# Patient Record
Sex: Female | Born: 1960 | Race: White | Hispanic: No | Smoking: Current every day smoker
Health system: Southern US, Community
[De-identification: ages and names within clinical notes are randomized; demographics above are authoritative.]

## PROBLEM LIST (undated history)

## (undated) DIAGNOSIS — N289 Disorder of kidney and ureter, unspecified: Secondary | ICD-10-CM

## (undated) DIAGNOSIS — F329 Major depressive disorder, single episode, unspecified: Secondary | ICD-10-CM

## (undated) DIAGNOSIS — M549 Dorsalgia, unspecified: Secondary | ICD-10-CM

## (undated) DIAGNOSIS — G4700 Insomnia, unspecified: Secondary | ICD-10-CM

## (undated) DIAGNOSIS — M5432 Sciatica, left side: Secondary | ICD-10-CM

## (undated) DIAGNOSIS — G8929 Other chronic pain: Secondary | ICD-10-CM

## (undated) DIAGNOSIS — F32A Depression, unspecified: Secondary | ICD-10-CM

---

## 2015-03-28 ENCOUNTER — Emergency Department (HOSPITAL_COMMUNITY)
Admission: EM | Admit: 2015-03-28 | Discharge: 2015-03-28 | Disposition: A | Discharge status: Home / Self Care | Payer: Self-pay | Attending: Emergency Medicine | Admitting: Emergency Medicine

## 2015-03-28 ENCOUNTER — Encounter (HOSPITAL_COMMUNITY): Payer: Self-pay | Admitting: Emergency Medicine

## 2015-03-28 DIAGNOSIS — M5442 Lumbago with sciatica, left side: Secondary | ICD-10-CM | POA: Insufficient documentation

## 2015-03-28 DIAGNOSIS — F329 Major depressive disorder, single episode, unspecified: Secondary | ICD-10-CM | POA: Insufficient documentation

## 2015-03-28 DIAGNOSIS — F17210 Nicotine dependence, cigarettes, uncomplicated: Secondary | ICD-10-CM | POA: Insufficient documentation

## 2015-03-28 DIAGNOSIS — Z79899 Other long term (current) drug therapy: Secondary | ICD-10-CM | POA: Insufficient documentation

## 2015-03-28 DIAGNOSIS — M5432 Sciatica, left side: Secondary | ICD-10-CM

## 2015-03-28 HISTORY — DX: Major depressive disorder, single episode, unspecified: F32.9

## 2015-03-28 HISTORY — DX: Depression, unspecified: F32.A

## 2015-03-28 HISTORY — DX: Insomnia, unspecified: G47.00

## 2015-03-28 MED ORDER — IBUPROFEN 800 MG PO TABS
800.0000 mg | ORAL_TABLET | Freq: Once | ORAL | Status: AC
Start: 2015-03-28 — End: 2015-03-28
  Administered 2015-03-28: 800 mg via ORAL
  Filled 2015-03-28: qty 1

## 2015-03-28 MED ORDER — NAPROXEN 500 MG PO TABS: 500.0000 mg | ORAL_TABLET | Freq: Two times a day (BID) | ORAL | Status: DC

## 2015-03-28 MED ORDER — METHOCARBAMOL 500 MG PO TABS: 500.0000 mg | ORAL_TABLET | Freq: Three times a day (TID) | ORAL | Status: DC

## 2015-03-28 MED ORDER — METHOCARBAMOL 500 MG PO TABS
500.0000 mg | ORAL_TABLET | Freq: Once | ORAL | Status: AC
  Administered 2015-03-28: 500 mg via ORAL
  Filled 2015-03-28: qty 1

## 2015-03-28 MED ORDER — TRAMADOL HCL 50 MG PO TABS: 50.0000 mg | ORAL_TABLET | Freq: Four times a day (QID) | ORAL | Status: DC | PRN

## 2015-03-28 NOTE — Discharge Instructions (Signed)
Sciatica °Sciatica is pain, weakness, numbness, or tingling along your sciatic nerve. The nerve starts in the lower back and runs down the back of each leg. Nerve damage or certain conditions pinch or put pressure on the sciatic nerve. This causes the pain, weakness, and other discomforts of sciatica. °HOME CARE  °· Only take medicine as told by your doctor. °· Apply ice to the affected area for 20 minutes. Do this 3-4 times a day for the first 48-72 hours. Then try heat in the same way. °· Exercise, stretch, or do your usual activities if these do not make your pain worse. °· Go to physical therapy as told by your doctor. °· Keep all doctor visits as told. °· Do not wear high heels or shoes that are not supportive. °· Get a firm mattress if your mattress is too soft to lessen pain and discomfort. °GET HELP RIGHT AWAY IF:  °· You cannot control when you poop (bowel movement) or pee (urinate). °· You have more weakness in your lower back, lower belly (pelvis), butt (buttocks), or legs. °· You have redness or puffiness (swelling) of your back. °· You have a burning feeling when you pee. °· You have pain that gets worse when you lie down. °· You have pain that wakes you from your sleep. °· Your pain is worse than past pain. °· Your pain lasts longer than 4 weeks. °· You are suddenly losing weight without reason. °MAKE SURE YOU:  °· Understand these instructions. °· Will watch this condition. °· Will get help right away if you are not doing well or get worse. °  °This information is not intended to replace advice given to you by your health care provider. Make sure you discuss any questions you have with your health care provider. °  °Document Released: 10/06/2007 Document Revised: 09/17/2014 Document Reviewed: 05/08/2011 °Elsevier Interactive Patient Education ©2016 [employer] ° ° °[facility] Doctor List ° ° ° °Edward Hawkins MD. Specialty: Pulmonary Disease Contact information: [address]  °[address]  °[place] [address]  °[phone]  ° °Margaret Simpson, MD. Specialty: Family Medicine Contact information: [address]  °[place] [address]  °[phone]  ° °Scott Luking, MD. Specialty: Family Medicine Contact information: [address]  °[address]  °[place] [address]  °[phone]  ° °Tesfaye Fanta, MD Specialty: Internal Medicine Contact information: [address]  °[place] [address]  °[phone]  ° °Zach Hall, MD. Specialty: Internal Medicine Contact information: [address]  °[place] [address]  °[phone]  ° °Angus Mcinnis, MD. Specialty: Family Medicine Contact information: [address]  °[place] [address]  °[phone]  ° °Stephen Knowlton, MD. Specialty: Family Medicine Contact information: [address]  °[address]  ° [place] [address]  °[phone]  ° °Roy Fagan, MD. Specialty: Internal Medicine Contact information: [address]  °[address]  °  [address]  °[phone]  ° ° °

## 2015-03-28 NOTE — ED Notes (Signed)
PT c/o lower back pain radiating down left leg after picking up a clothes basket x2 days with hx of DJD per pt.

## 2015-03-29 NOTE — ED Provider Notes (Signed)
CSN: [id number]     Arrival date & time 03/28/15  1419 History   First MD Initiated Contact with Patient 03/28/15 1551     Chief Complaint  Patient presents with  . Back Pain     (Consider location/radiation/quality/duration/timing/severity/associated sxs/prior Treatment) HPI  Meredith Marquez is a 55 y.o. female who presents to the Emergency Department complaining of left sided low back pain for 2 days.  She states that she bent over to pick up a basket of clothes and felt a sharp pain into her lower back that radiates into her left buttock, hip and lower leg.  Pain is constant and sharp.  She reports hx of same.  She has taken tylenol without relief.  She denies abd pain, fever, chills, urine or bowel changes, numbness or weakness of the lower extremities.     Past Medical History  Diagnosis Date  . Insomnia   . Depression    History reviewed. No pertinent past surgical history. Family History  Problem Relation Age of Onset  . Seizures Other    Social History  Substance Use Topics  . Smoking status: Current Every Day Smoker -- 1.00 packs/day for 40 years    Types: Cigarettes  . Smokeless tobacco: Never Used  . Alcohol Use: Yes     Comment: occasional   OB History    Gravida Para Term Preterm AB TAB SAB Ectopic Multiple Living   3 3 3       3      Review of Systems  Constitutional: Negative for fever.  Respiratory: Negative for shortness of breath.   Gastrointestinal: Negative for vomiting, abdominal pain and constipation.  Genitourinary: Negative for dysuria, hematuria, flank pain, decreased urine volume and difficulty urinating.  Musculoskeletal: Positive for back pain. Negative for joint swelling.  Skin: Negative for rash.  Neurological: Negative for weakness and numbness.  All other systems reviewed and are negative.     Allergies  Codeine; Penicillins; and Tylenol  Home Medications   Prior to Admission medications   Medication Sig Start Date End Date Taking?  Authorizing Provider  sertraline (ZOLOFT) 50 MG tablet Take 50 mg by mouth daily.   Yes Historical Provider, MD  traZODone (DESYREL) 100 MG tablet Take 100 mg by mouth at bedtime.   Yes Historical Provider, MD  methocarbamol (ROBAXIN) 500 MG tablet Take 1 tablet (500 mg total) by mouth 3 (three) times daily. 03/28/15   Markie Heffernan, PA-C  naproxen (NAPROSYN) 500 MG tablet Take 1 tablet (500 mg total) by mouth 2 (two) times daily with a meal. 03/28/15   Jireh Elmore, PA-C  traMADol (ULTRAM) 50 MG tablet Take 1 tablet (50 mg total) by mouth every 6 (six) hours as needed. 03/28/15   Caitlain Tweed, PA-C   BP 147/69 mmHg  Pulse 92  Temp(Src) 97.7 F (36.5 C) (Oral)  Resp 16  Ht 4\' 11"  (1.499 m)  Wt 45.36 kg  BMI 20.19 kg/m2  SpO2 100% Physical Exam  Constitutional: She is oriented to person, place, and time. She appears well-developed and well-nourished. No distress.  HENT:  Head: Normocephalic and atraumatic.  Neck: Normal range of motion. Neck supple.  Cardiovascular: Normal rate, regular rhythm, normal heart sounds and intact distal pulses.   No murmur heard. Pulmonary/Chest: Effort normal and breath sounds normal. No respiratory distress.  Abdominal: Soft. She exhibits no distension. There is no tenderness. There is no rebound.  Musculoskeletal: She exhibits tenderness. She exhibits no edema.       Lumbar  back: She exhibits tenderness and pain. She exhibits normal range of motion, no swelling, no deformity, no laceration and normal pulse.  ttp of the left lumbar paraspinal muscles and SI joint.  No spinal tenderness.  DP pulses are brisk and symmetrical.  Distal sensation intact.  Pt has 5/5 strength against resistance of bilateral lower extremities.     Neurological: She is alert and oriented to person, place, and time. She has normal strength. No sensory deficit. She exhibits normal muscle tone. Coordination and gait normal.  Reflex Scores:      Patellar reflexes are 2+ on the right  side and 2+ on the left side.      Achilles reflexes are 2+ on the right side and 2+ on the left side. Skin: Skin is warm and dry. No rash noted.  Nursing note and vitals reviewed.   ED Course  Procedures (including critical care time) Labs Review Labs Reviewed - No data to display  Imaging Review No results found. I have personally reviewed and evaluated these images and lab results as part of my medical decision-making.   EKG Interpretation None      MDM   Final diagnoses:  Left sided sciatica    Pt is well appearing, ambulates with a steady gait.  No focal neuro deficits.  No concerning sx's for emergent neurological or infectious process.  She appears stable for d/c and given referral info to establish primary care.      Pauline Aus, PA-C 03/29/15 2235  Bethann Berkshire, MD 04/01/15 2004

## 2015-12-20 ENCOUNTER — Encounter (HOSPITAL_COMMUNITY): Payer: Self-pay | Admitting: Emergency Medicine

## 2015-12-20 ENCOUNTER — Emergency Department (HOSPITAL_COMMUNITY): Payer: Self-pay

## 2015-12-20 ENCOUNTER — Emergency Department (HOSPITAL_COMMUNITY)
Admission: EM | Admit: 2015-12-20 | Discharge: 2015-12-20 | Disposition: A | Discharge status: Home / Self Care | Payer: Self-pay | Attending: Emergency Medicine | Admitting: Emergency Medicine

## 2015-12-20 DIAGNOSIS — Y929 Unspecified place or not applicable: Secondary | ICD-10-CM | POA: Insufficient documentation

## 2015-12-20 DIAGNOSIS — Y999 Unspecified external cause status: Secondary | ICD-10-CM | POA: Insufficient documentation

## 2015-12-20 DIAGNOSIS — Z7982 Long term (current) use of aspirin: Secondary | ICD-10-CM | POA: Insufficient documentation

## 2015-12-20 DIAGNOSIS — Y939 Activity, unspecified: Secondary | ICD-10-CM | POA: Insufficient documentation

## 2015-12-20 DIAGNOSIS — M5442 Lumbago with sciatica, left side: Secondary | ICD-10-CM | POA: Insufficient documentation

## 2015-12-20 DIAGNOSIS — N201 Calculus of ureter: Secondary | ICD-10-CM | POA: Insufficient documentation

## 2015-12-20 DIAGNOSIS — F17210 Nicotine dependence, cigarettes, uncomplicated: Secondary | ICD-10-CM | POA: Insufficient documentation

## 2015-12-20 DIAGNOSIS — W108XXA Fall (on) (from) other stairs and steps, initial encounter: Secondary | ICD-10-CM | POA: Insufficient documentation

## 2015-12-20 HISTORY — DX: Dorsalgia, unspecified: M54.9

## 2015-12-20 HISTORY — DX: Sciatica, left side: M54.32

## 2015-12-20 HISTORY — DX: Other chronic pain: G89.29

## 2015-12-20 LAB — CBC WITH DIFFERENTIAL/PLATELET
BASOS ABS: 0 10*3/uL (ref 0.0–0.1)
Basophils Relative: 0 %
Eosinophils Absolute: 0.1 10*3/uL (ref 0.0–0.7)
Eosinophils Relative: 1 %
HEMATOCRIT: 36.6 % (ref 36.0–46.0)
Hemoglobin: 12.2 g/dL (ref 12.0–15.0)
LYMPHS PCT: 23 %
Lymphs Abs: 2 10*3/uL (ref 0.7–4.0)
MCH: 30.7 pg (ref 26.0–34.0)
MCHC: 33.3 g/dL (ref 30.0–36.0)
MCV: 92.2 fL (ref 78.0–100.0)
MONO ABS: 0.4 10*3/uL (ref 0.1–1.0)
Monocytes Relative: 4 %
NEUTROS ABS: 6.2 10*3/uL (ref 1.7–7.7)
Neutrophils Relative %: 72 %
Platelets: 265 10*3/uL (ref 150–400)
RBC: 3.97 MIL/uL (ref 3.87–5.11)
RDW: 13.2 % (ref 11.5–15.5)
WBC: 8.6 10*3/uL (ref 4.0–10.5)

## 2015-12-20 LAB — BASIC METABOLIC PANEL
ANION GAP: 9 (ref 5–15)
BUN: 15 mg/dL (ref 6–20)
CO2: 26 mmol/L (ref 22–32)
Calcium: 9.3 mg/dL (ref 8.9–10.3)
Chloride: 105 mmol/L (ref 101–111)
Creatinine, Ser: 0.78 mg/dL (ref 0.44–1.00)
GFR calc Af Amer: >60 mL/min (ref >60)
GLUCOSE: 126 mg/dL — AB (ref 65–99)
POTASSIUM: 3.3 mmol/L — AB (ref 3.5–5.1)
Sodium: 140 mmol/L (ref 135–145)

## 2015-12-20 LAB — URINALYSIS, ROUTINE W REFLEX MICROSCOPIC
BACTERIA UA: NONE SEEN
Bilirubin Urine: NEGATIVE
Glucose, UA: NEGATIVE mg/dL
Ketones, ur: 5 mg/dL — AB
Nitrite: NEGATIVE
PROTEIN: NEGATIVE mg/dL
Specific Gravity, Urine: 1.012 (ref 1.005–1.030)
pH: 5 (ref 5.0–8.0)

## 2015-12-20 MED ORDER — KETOROLAC TROMETHAMINE 60 MG/2ML IM SOLN
30.0000 mg | Freq: Once | INTRAMUSCULAR | Status: AC
  Administered 2015-12-20: 30 mg via INTRAMUSCULAR
  Filled 2015-12-20: qty 2

## 2015-12-20 MED ORDER — CYCLOBENZAPRINE HCL 10 MG PO TABS
10.0000 mg | ORAL_TABLET | Freq: Once | ORAL | Status: AC
  Administered 2015-12-20: 10 mg via ORAL
  Filled 2015-12-20: qty 1

## 2015-12-20 MED ORDER — TRAMADOL HCL 50 MG PO TABS: 50.0000 mg | ORAL_TABLET | Freq: Four times a day (QID) | ORAL | 0 refills | Status: AC | PRN

## 2015-12-20 MED ORDER — KETOROLAC TROMETHAMINE 60 MG/2ML IM SOLN: 60.0000 mg | Freq: Once | INTRAMUSCULAR | Status: DC

## 2015-12-20 MED ORDER — NAPROXEN 250 MG PO TABS: 250.0000 mg | ORAL_TABLET | Freq: Two times a day (BID) | ORAL | 0 refills | Status: AC | PRN

## 2015-12-20 MED ORDER — METHOCARBAMOL 500 MG PO TABS: 1000.0000 mg | ORAL_TABLET | Freq: Four times a day (QID) | ORAL | 0 refills | Status: AC | PRN

## 2015-12-20 NOTE — ED Notes (Signed)
Pt walked to and from restroom with steady gait.

## 2015-12-20 NOTE — Discharge Instructions (Signed)
Take the prescriptions as directed.  Apply moist heat or ice to the area(s) of discomfort, for 15 minutes at a time, several times per day for the next few days.  Do not fall asleep on a heating or ice pack.  Call your regular medical doctor and the Urologist tomorrow to schedule a follow up appointment this week.  Return to the Emergency Department immediately if worsening.

## 2015-12-20 NOTE — ED Provider Notes (Signed)
[facility]PT Provider Note   CSN: [id number] Arrival date & time: 12/20/15  1526     History   Chief Complaint Chief Complaint  Patient presents with  . Fall    HPI Meredith Marquez is a 55 y.o. female.  HPI  Pt was seen at 1735. Per pt, c/o gradual onset and persistence of constant acute flair of her chronic low back "pain" since yesterday. Pt states the pain began after she slipped and fell onto her buttocks. Describes the pain as located in her lower back and radiating down her left leg, per her usual chronic pain pattern.  Pain worsens with palpation of the area and body position changes. Denies incont/retention of bowel or bladder, no saddle anesthesia, no focal motor weakness, no tingling/numbness in extremities, no fevers, no abd pain.   The symptoms have been associated with no other complaints. The patient has a significant history of similar symptoms previously, recently being evaluated for this complaint and multiple prior evals for same.    Past Medical History:  Diagnosis Date  . Chronic back pain   . Depression   . Insomnia   . Sciatica of left side     There are no active problems to display for this patient.   Past Surgical History:  Procedure Laterality Date  . CESAREAN SECTION      OB History    Gravida Para Term Preterm AB Living   3 3 3     3    SAB TAB Ectopic Multiple Live Births                   Home Medications    Prior to Admission medications   Medication Sig Start Date End Date Taking? Authorizing Provider  methocarbamol (ROBAXIN) 500 MG tablet Take 1 tablet (500 mg total) by mouth 3 (three) times daily. 03/28/15   Tammy Triplett, PA-C  naproxen (NAPROSYN) 500 MG tablet Take 1 tablet (500 mg total) by mouth 2 (two) times daily with a meal. 03/28/15   Tammy Triplett, PA-C  sertraline (ZOLOFT) 50 MG tablet Take 50 mg by mouth daily.    Historical Provider, MD  traMADol (ULTRAM) 50 MG tablet Take 1 tablet (50 mg total) by mouth every 6  (six) hours as needed. 03/28/15   Tammy Triplett, PA-C  traZODone (DESYREL) 100 MG tablet Take 100 mg by mouth at bedtime.    Historical Provider, MD    Family History Family History  Problem Relation Age of Onset  . Seizures Other     Social History Social History  Substance Use Topics  . Smoking status: Current Every Day Smoker    Packs/day: 1.00    Years: 40.00    Types: Cigarettes  . Smokeless tobacco: Never Used  . Alcohol use Yes     Comment: occasional     Allergies   Codeine; Penicillins; and Tylenol [acetaminophen]   Review of Systems Review of Systems ROS: Statement: All systems negative except as marked or noted in the HPI; Constitutional: Negative for fever and chills. ; ; Eyes: Negative for eye pain, redness and discharge. ; ; ENMT: Negative for ear pain, hoarseness, nasal congestion, sinus pressure and sore throat. ; ; Cardiovascular: Negative for chest pain, palpitations, diaphoresis, dyspnea and peripheral edema. ; ; Respiratory: Negative for cough, wheezing and stridor. ; ; Gastrointestinal: Negative for nausea, vomiting, diarrhea, abdominal pain, blood in stool, hematemesis, jaundice and rectal bleeding. . ; ; Genitourinary: Negative for dysuria, flank pain and hematuria. ; ;  Musculoskeletal: +LBP. Negative for neck pain. Negative for swelling and trauma.; ; Skin: Negative for pruritus, rash, abrasions, blisters, bruising and skin lesion.; ; Neuro: Negative for headache, lightheadedness and neck stiffness. Negative for weakness, altered level of consciousness, altered mental status, extremity weakness, paresthesias, involuntary movement, seizure and syncope.     Physical Exam Updated Vital Signs BP 171/69 (BP Location: Left Arm)   Pulse 95   Temp 97.6 F (36.4 C) (Oral)   Resp 18   Ht 4\' 8"  (1.422 m)   Wt 86 lb (39 kg)   SpO2 100%   BMI 19.28 kg/m   Physical Exam 1740: Physical examination:  Nursing notes reviewed; Vital signs and O2 SAT reviewed;   Constitutional: Well developed, Well nourished, Well hydrated, In no acute distress; Head:  Normocephalic, atraumatic; Eyes: EOMI, PERRL, No scleral icterus; ENMT: Mouth and pharynx normal, Mucous membranes moist; Neck: Supple, Full range of motion, No lymphadenopathy; Cardiovascular: Regular rate and rhythm, No murmur, rub, or gallop; Respiratory: Breath sounds clear & equal bilaterally, No rales, rhonchi, wheezes.  Speaking full sentences with ease, Normal respiratory effort/excursion; Chest: Nontender, Movement normal; Abdomen: Soft, Nontender, Nondistended, Normal bowel sounds; Genitourinary: No CVA tenderness; Spine:  No midline CS, TS, LS tenderness. +TTP left lumbar paraspinal muscles. No rash, no ecchymosis.;; Extremities: Pulses normal, No tenderness, No edema, No calf edema or asymmetry.; Neuro: AA&Ox3, Major CN grossly intact.  Speech clear. No gross focal motor or sensory deficits in extremities. Strength 5/5 equal bilat UE's and LE's, including great toe dorsiflexion.  DTR 2/4 equal bilat UE's and LE's.  No gross sensory deficits.  Neg straight leg raises bilat.  Climbs on and off stretcher easily by herself. Gait steady.; Skin: Color normal, Warm, Dry.   ED Treatments / Results  Labs (all labs ordered are listed, but only abnormal results are displayed)   EKG  EKG Interpretation None       Radiology   Procedures Procedures (including critical care time)  Medications Ordered in ED Medications  cyclobenzaprine (FLEXERIL) tablet 10 mg (not administered)  ketorolac (TORADOL) injection 30 mg (not administered)     Initial Impression / Assessment and Plan / ED Course  I have reviewed the triage vital signs and the nursing notes.  Pertinent labs & imaging results that were available during my care of the patient were reviewed by me and considered in my medical decision making (see chart for details).  MDM Reviewed: previous chart, nursing note and vitals Interpretation: CT  scan and labs    Ct Lumbar Spine Wo Contrast Result Date: 12/20/2015 CLINICAL DATA:  Fall on IAC steps yesterday. Chronic low back pain and left leg pain. Unable to urinate since yesterday. EXAM: CT LUMBAR SPINE WITHOUT CONTRAST TECHNIQUE: Multidetector CT imaging of the lumbar spine was performed without intravenous contrast administration. Multiplanar CT image reconstructions were also generated. COMPARISON:  None. FINDINGS: Segmentation: 5 lumbar type vertebrae. Alignment: Mild lumbar levoscoliosis. 3 mm degenerative anterolisthesis of L4 on L5. Vertebrae: No acute fracture or focal pathologic process. Paraspinal and other soft tissues: Abdominal aortic atherosclerosis without aneurysm. Severe left hydronephrosis with moderate renal cortical thinning. The visualized portion of the left ureter is moderately dilated diffusely. The distal left ureter in the pelvis was not imaged. Unremarkable noncontrast appearance of the right kidney. Disc levels: T12-L1:  Discal calcification.  No disc herniation or stenosis. L1-2:  Negative. L2-3:  Negative. L3-4: Minimal right facet arthrosis. No disc herniation or stenosis. L4-5: Moderate disc space narrowing with vacuum  disc phenomenon. Listhesis with asymmetric leftward bulging of the uncovered disc and mild facet arthrosis result in minimal left neural foraminal narrowing. No evidence of exiting left L4 nerve compression. No spinal stenosis. L5-S1: Suspected small left paracentral disc protrusion near the left S1 nerve root. No stenosis. IMPRESSION: 1. No acute fracture. 2. L4-5 predominant disc degeneration with minimal left neural foraminal narrowing. 3. Likely small left paracentral disc protrusion at L5-S1. 4. Severe left hydronephrosis with diffuse dilatation of the visualized portion of the left ureter (distal left ureter not imaged). Given left renal cortical thinning and lack of perinephric stranding, this may not be acute. 5. Aortic atherosclerosis.  Electronically Signed   By: Sebastian AcheAllen  Grady M.D.   On: 12/20/2015 19:22   Ct Renal Stone Study Result Date: 12/20/2015 CLINICAL DATA:  Unable to urinate since yesterday. EXAM: CT ABDOMEN AND PELVIS WITHOUT CONTRAST TECHNIQUE: Multidetector CT imaging of the abdomen and pelvis was performed following the standard protocol without IV contrast. COMPARISON:  CT of the lumbosacral spine 12/20/2015 FINDINGS: Lower chest: No acute abnormality. Hepatobiliary: No focal liver abnormality is seen. No gallstones, gallbladder wall thickening, or biliary dilatation. Pancreas: Unremarkable. No pancreatic ductal dilatation or surrounding inflammatory changes. Spleen: Normal in size without focal abnormality. Adrenals/Urinary Tract: There is severe left hydronephrosis and hydroureter likely caused by 8 10 mm obstructive distal left ureteral calculus located within the pelvic portion of the ureter. There is perinephric stranding. There is left greater than right retroperitoneal lymphadenopathy. With the largest lymph node measuring 13 mm in short axis. Stomach/Bowel: Stomach is within normal limits. Appendix appears normal. No evidence of bowel wall thickening, distention, or inflammatory changes. Vascular/Lymphatic: No significant vascular findings are present. Reproductive: Uterus and bilateral adnexa are unremarkable. Bilateral fallopian tube closure devices in place. Other: No abdominal wall hernia or abnormality. No abdominopelvic ascites. Musculoskeletal: No acute findings. Osteoarthritic changes of the lumbosacral spine. IMPRESSION: 10 mm left distal ureteral calculus with associated severe obstructive uropathy. Left greater than right retroperitoneal lymphadenopathy. This may represent reactive lymphadenopathy, however follow-up with contrast-enhanced CT after resolution of patient's acute symptoms may be considered to exclude etiology other than left obstructive uropathy. Electronically Signed   By: Ted Mcalpineobrinka  Dimitrova M.D.    On: 12/20/2015 20:37   Results for orders placed or performed during the hospital encounter of 12/20/15  Basic metabolic panel  Result Value Ref Range   Sodium 140 135 - 145 mmol/L   Potassium 3.3 (L) 3.5 - 5.1 mmol/L   Chloride 105 101 - 111 mmol/L   CO2 26 22 - 32 mmol/L   Glucose, Bld 126 (H) 65 - 99 mg/dL   BUN 15 6 - 20 mg/dL   Creatinine, Ser 6.040.78 0.44 - 1.00 mg/dL   Calcium 9.3 8.9 - 54.010.3 mg/dL   GFR calc non Af Amer >60 >60 mL/min   GFR calc Af Amer >60 >60 mL/min   Anion gap 9 5 - 15  CBC with Differential  Result Value Ref Range   WBC 8.6 4.0 - 10.5 K/uL   RBC 3.97 3.87 - 5.11 MIL/uL   Hemoglobin 12.2 12.0 - 15.0 g/dL   HCT 98.136.6 19.136.0 - 47.846.0 %   MCV 92.2 78.0 - 100.0 fL   MCH 30.7 26.0 - 34.0 pg   MCHC 33.3 30.0 - 36.0 g/dL   RDW 29.513.2 62.111.5 - 30.815.5 %   Platelets 265 150 - 400 K/uL   Neutrophils Relative % 72 %   Neutro Abs 6.2 1.7 - 7.7 K/uL  Lymphocytes Relative 23 %   Lymphs Abs 2.0 0.7 - 4.0 K/uL   Monocytes Relative 4 %   Monocytes Absolute 0.4 0.1 - 1.0 K/uL   Eosinophils Relative 1 %   Eosinophils Absolute 0.1 0.0 - 0.7 K/uL   Basophils Relative 0 %   Basophils Absolute 0.0 0.0 - 0.1 K/uL  Urinalysis, Routine w reflex microscopic  Result Value Ref Range   Color, Urine YELLOW YELLOW   APPearance CLEAR CLEAR   Specific Gravity, Urine 1.012 1.005 - 1.030   pH 5.0 5.0 - 8.0   Glucose, UA NEGATIVE NEGATIVE mg/dL   Hgb urine dipstick SMALL (A) NEGATIVE   Bilirubin Urine NEGATIVE NEGATIVE   Ketones, ur 5 (A) NEGATIVE mg/dL   Protein, ur NEGATIVE NEGATIVE mg/dL   Nitrite NEGATIVE NEGATIVE   Leukocytes, UA TRACE (A) NEGATIVE   RBC / HPF 0-5 0 - 5 RBC/hpf   WBC, UA 6-30 0 - 5 WBC/hpf   Bacteria, UA NONE SEEN NONE SEEN    2050:  Pt has climbed off her stretcher easily by herself, ambulated to the bathroom and urinated several times while in the ED. When ED RN and I went to check on pt, she was sitting on stretcher comfortably and eating a fast food meal  with family members in the room. Pt initially told ED RN she "hadn't urinated" since yesterday afternoon, but now states that has not been the case. Endorses now that she "saw blood in my urine last month" and has been urinating regularly. CT scans as above. Udip and labs ordered.   2225:  T/C to Uro Dr. Berneice HeinrichManny, case discussed, including:  HPI, pertinent PM/SHx, VS/PE, dx testing, ED course and treatment:  He has viewed the CT scan, states these changes are not acute and have been present for quite some time, tx symptomatically, f/u in office. Dx and testing, as well as d/w Uro MD, d/w pt.  Questions answered.  Verb understanding, agreeable to d/c home with outpt f/u.   Final Clinical Impressions(s) / ED Diagnoses   Final diagnoses:  None    New Prescriptions New Prescriptions   No medications on file     Samuel JesterKathleen Mellisa Arshad, DO 12/24/15 2059

## 2015-12-20 NOTE — ED Triage Notes (Signed)
PT states she slipped and fell onto her outdoor steps yesterday and landed onto her buttock and started having lower back pain that radiates down left leg. PT also states she has not been able to urinate since the fall at 1600 yesterday evening.

## 2015-12-22 LAB — URINE CULTURE

## 2016-03-07 ENCOUNTER — Emergency Department
Admission: EM | Admit: 2016-03-07 | Discharge: 2016-03-07 | Disposition: A | Discharge status: Home / Self Care | Payer: Self-pay | Attending: Emergency Medicine | Admitting: Emergency Medicine

## 2016-03-07 DIAGNOSIS — J069 Acute upper respiratory infection, unspecified: Secondary | ICD-10-CM | POA: Insufficient documentation

## 2016-03-07 DIAGNOSIS — N390 Urinary tract infection, site not specified: Secondary | ICD-10-CM | POA: Insufficient documentation

## 2016-03-07 DIAGNOSIS — N132 Hydronephrosis with renal and ureteral calculous obstruction: Secondary | ICD-10-CM | POA: Insufficient documentation

## 2016-03-07 DIAGNOSIS — F17210 Nicotine dependence, cigarettes, uncomplicated: Secondary | ICD-10-CM | POA: Insufficient documentation

## 2016-03-07 HISTORY — DX: Disorder of kidney and ureter, unspecified: N28.9

## 2016-03-07 LAB — URINALYSIS, COMPLETE (UACMP) WITH MICROSCOPIC
Bacteria, UA: NONE SEEN
Bilirubin Urine: NEGATIVE
GLUCOSE, UA: NEGATIVE mg/dL
Ketones, ur: 20 mg/dL — AB
NITRITE: NEGATIVE
PH: 6 (ref 5.0–8.0)
Protein, ur: NEGATIVE mg/dL
SPECIFIC GRAVITY, URINE: 1.021 (ref 1.005–1.030)
Squamous Epithelial / LPF: NONE SEEN

## 2016-03-07 MED ORDER — ONDANSETRON 4 MG PO TBDP
4.0000 mg | ORAL_TABLET | Freq: Once | ORAL | Status: AC
  Administered 2016-03-07: 4 mg via ORAL
  Filled 2016-03-07: qty 1

## 2016-03-07 MED ORDER — ONDANSETRON HCL 8 MG PO TABS: 8.0000 mg | ORAL_TABLET | Freq: Three times a day (TID) | ORAL | 0 refills | Status: AC | PRN

## 2016-03-07 MED ORDER — HYDROCOD POLST-CPM POLST ER 10-8 MG/5ML PO SUER
5.0000 mL | Freq: Once | ORAL | Status: AC
  Administered 2016-03-07: 5 mL via ORAL
  Filled 2016-03-07: qty 5

## 2016-03-07 MED ORDER — PSEUDOEPH-BROMPHEN-DM 30-2-10 MG/5ML PO SYRP: 5.0000 mL | ORAL_SOLUTION | Freq: Four times a day (QID) | ORAL | 0 refills | Status: AC | PRN

## 2016-03-07 MED ORDER — SULFAMETHOXAZOLE-TRIMETHOPRIM 800-160 MG PO TABS: 1.0000 | ORAL_TABLET | Freq: Two times a day (BID) | ORAL | 0 refills | Status: AC

## 2016-03-07 MED ORDER — ONDANSETRON 8 MG PO TBDP
8.0000 mg | ORAL_TABLET | Freq: Once | ORAL | Status: AC
  Administered 2016-03-07: 8 mg via ORAL
  Filled 2016-03-07: qty 1

## 2016-03-07 MED ORDER — TRAMADOL HCL 50 MG PO TABS: 50.0000 mg | ORAL_TABLET | Freq: Four times a day (QID) | ORAL | 0 refills | Status: AC | PRN

## 2016-03-07 NOTE — ED Notes (Addendum)
States she has had n/v and weakness for couple of days. Cough and congestion for the past week   Also fell last pm  Landed on tailbone

## 2016-03-07 NOTE — ED Triage Notes (Signed)
Pt c/o cough with congestion and nausea for the past week.

## 2016-03-07 NOTE — ED Provider Notes (Signed)
[facility] Emergency Department Provider Note   ____________________________________________   First MD Initiated Contact with Patient 03/07/16 1455     (approximate)  I have reviewed the triage vital signs and the nursing notes.   HISTORY  Chief Complaint URI    HPI Meredith Marquez is a 56 y.o. female patient triage with complaint of cough, congestion, and nausea for 1 week. However patient arrives in the exam room she is complaining of dysuria, frequency, chronic low back pain, and I resolved kidney stone. Patient was seen in December 2017 and diagnosed for 10 mm ureteral stone with hydronephrosis. Patient was advised to follow with urology and did not comply.Patient is also complaining of back pain secondary to a fall last night. Patient rates the pain as a 5/10. No palliative measures for this complaint.  Past Medical History:  Diagnosis Date  . Chronic back pain   . Depression   . Insomnia   . Renal disorder    Kidney stones  . Sciatica of left side     There are no active problems to display for this patient.   Past Surgical History:  Procedure Laterality Date  . CESAREAN SECTION      Prior to Admission medications   Medication Sig Start Date End Date Taking? Authorizing Provider  aspirin EC 81 MG tablet Take 81 mg by mouth daily.    Historical Provider, MD  brompheniramine-pseudoephedrine-DM 30-2-10 MG/5ML syrup Take 5 mLs by mouth 4 (four) times daily as needed. 03/07/16   Joni Reiningonald K Jaedin Regina, PA-C  ibuprofen (ADVIL,MOTRIN) 200 MG tablet Take 600 mg by mouth every 6 (six) hours as needed for mild pain or moderate pain.    Historical Provider, MD  methocarbamol (ROBAXIN) 500 MG tablet Take 2 tablets (1,000 mg total) by mouth 4 (four) times daily as needed for muscle spasms (muscle spasm/pain). 12/20/15   Samuel JesterKathleen McManus, DO  naproxen (NAPROSYN) 250 MG tablet Take 1 tablet (250 mg total) by mouth 2 (two) times daily as needed for mild pain or  moderate pain (take with food). 12/20/15   Samuel JesterKathleen McManus, DO  ondansetron (ZOFRAN) 8 MG tablet Take 1 tablet (8 mg total) by mouth every 8 (eight) hours as needed for nausea or vomiting. 03/07/16   Joni Reiningonald K Eldar Robitaille, PA-C  sertraline (ZOLOFT) 50 MG tablet Take 50 mg by mouth daily.    Historical Provider, MD  sulfamethoxazole-trimethoprim (BACTRIM DS,SEPTRA DS) 800-160 MG tablet Take 1 tablet by mouth 2 (two) times daily. 03/07/16   Joni Reiningonald K Merriam Brandner, PA-C  traMADol (ULTRAM) 50 MG tablet Take 1 tablet (50 mg total) by mouth every 6 (six) hours as needed for moderate pain or severe pain. 12/20/15   Samuel JesterKathleen McManus, DO  traMADol (ULTRAM) 50 MG tablet Take 1 tablet (50 mg total) by mouth every 6 (six) hours as needed. 03/07/16 03/07/17  Joni Reiningonald K Azie Mcconahy, PA-C    Allergies Codeine; Penicillins; and Tylenol [acetaminophen]  Family History  Problem Relation Age of Onset  . Seizures Other     Social History Social History  Substance Use Topics  . Smoking status: Current Every Day Smoker    Packs/day: 1.00    Years: 40.00    Types: Cigarettes  . Smokeless tobacco: Never Used  . Alcohol use Yes     Comment: occasional    Review of Systems Constitutional: No fever/chills Eyes: No visual changes. ENT: No sore throat. Cardiovascular: Denies chest pain. Respiratory: Denies shortness of breath. Nonproductive cough Gastrointestinal: No abdominal pain.  Nausea and vomiting.  No diarrhea.  No constipation. Genitourinary: Hematuria Musculoskeletal: Chronic back pain. Left flank pain.  Skin: Negative for rash. Neurological: Negative for headaches, focal weakness or numbness.    ____________________________________________   PHYSICAL EXAM:  VITAL SIGNS: ED Triage Vitals [03/07/16 1438]  Enc Vitals Group     BP 132/61     Pulse Rate 97     Resp 16     Temp 97.6 F (36.4 C)     Temp Source Axillary     SpO2 98 %     Weight 87 lb (39.5 kg)     Height 4\' 11"  (1.499 m)     Head  Circumference      Peak Flow      Pain Score 8     Pain Loc      Pain Edu?      Excl. in GC?     Constitutional: Alert and oriented. Well appearing and in no acute distress. Eyes: Conjunctivae are normal. PERRL. EOMI. Head: Atraumatic. Nose: Edematous nasal turbinates clear rhinorrhea Mouth/Throat: Mucous membranes are moist.  Oropharynx non-erythematous. Neck: No stridor.  No cervical spine tenderness to palpation. Hematological/Lymphatic/Immunilogical: No cervical lymphadenopathy. Cardiovascular: Normal rate, regular rhythm. Grossly normal heart sounds.  Good peripheral circulation. Respiratory: Normal respiratory effort.  No retractions. Lungs CTAB. Nonproductive cough Gastrointestinal: Soft and nontender. No distention. No abdominal bruits. Left CVA tenderness. Musculoskeletal: No obvious spinal deformity. Moderate guarding palpation L3-S1. Patient has full nuchal range of motion lower extremities ambulate without distress. Neurologic:  Normal speech and language. No gross focal neurologic deficits are appreciated. No gait instability. Skin:  Skin is warm, dry and intact. No rash noted. Psychiatric: Mood and affect are normal. Speech and behavior are normal.  ____________________________________________   LABS (all labs ordered are listed, but only abnormal results are displayed)  Labs Reviewed  URINALYSIS, COMPLETE (UACMP) WITH MICROSCOPIC - Abnormal; Notable for the following:       Result Value   Color, Urine YELLOW (*)    APPearance CLOUDY (*)    Hgb urine dipstick SMALL (*)    Ketones, ur 20 (*)    Leukocytes, UA LARGE (*)    All other components within normal limits   ____________________________________________  EKG   ____________________________________________  RADIOLOGY   ____________________________________________   PROCEDURES  Procedure(s) performed: None  Procedures  Critical Care performed:  No  ____________________________________________   INITIAL IMPRESSION / ASSESSMENT AND PLAN / ED COURSE  Pertinent labs & imaging results that were available during my care of the patient were reviewed by me and considered in my medical decision making (see chart for details).  UTI. Discussed urinalysis results with patient. Reviewed CT scan taken December 2017 and discussed with patient rash now for follow-up with urologist. Patient also has a viral upper respiratory infection. Patient given discharge care instructions and prescription for Bactrim DS, Bromfed DM, tramadol, and Zofran.      ____________________________________________   FINAL CLINICAL IMPRESSION(S) / ED DIAGNOSES  Final diagnoses:  Viral upper respiratory tract infection  Hydronephrosis with urinary obstruction due to renal calculus  Urinary tract infection without hematuria, site unspecified      NEW MEDICATIONS STARTED DURING THIS VISIT:  New Prescriptions   BROMPHENIRAMINE-PSEUDOEPHEDRINE-DM 30-2-10 MG/5ML SYRUP    Take 5 mLs by mouth 4 (four) times daily as needed.   ONDANSETRON (ZOFRAN) 8 MG TABLET    Take 1 tablet (8 mg total) by mouth every 8 (eight) hours as needed for nausea or  vomiting.   SULFAMETHOXAZOLE-TRIMETHOPRIM (BACTRIM DS,SEPTRA DS) 800-160 MG TABLET    Take 1 tablet by mouth 2 (two) times daily.   TRAMADOL (ULTRAM) 50 MG TABLET    Take 1 tablet (50 mg total) by mouth every 6 (six) hours as needed.     Note:  This document was prepared using Dragon voice recognition software and may include unintentional dictation errors.    Joni Reining, PA-C 03/07/16 1548    Emily Filbert, MD 03/08/16 [phone]

## 2016-03-07 NOTE — ED Notes (Signed)
Vomited large amt of liquid

## 2017-12-26 IMAGING — CT CT RENAL STONE PROTOCOL
2 of 4 series · 16 of 46 positions shown, 18 images · non-contrast
Comparison: CT of the lumbosacral spine 12/20/2015

CLINICAL DATA: Unable to urinate since yesterday.

EXAM:
CT ABDOMEN AND PELVIS WITHOUT CONTRAST
TECHNIQUE: Multidetector CT imaging of the abdomen and pelvis was performed
following the standard protocol without IV contrast.

[Series 2: axial st · axial · 0.58mm/px · z∈[-468,-124]mm · 13 of 77 slices shown, 15 images]
[im 4/77  soft-tissue]
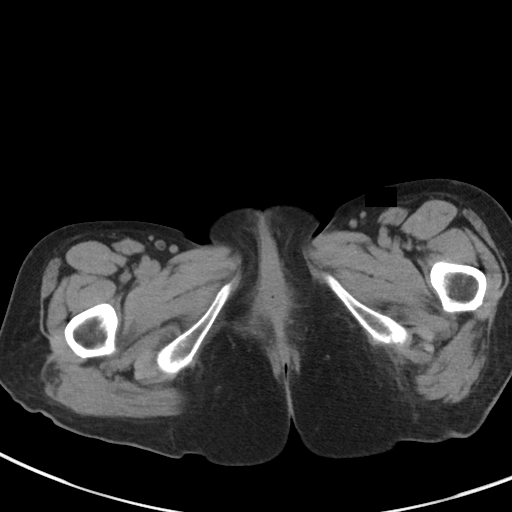
[im 4/77  bone]
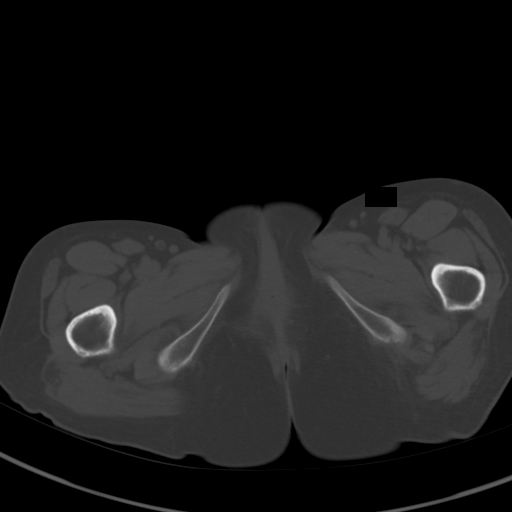
[im 10/77  soft-tissue]
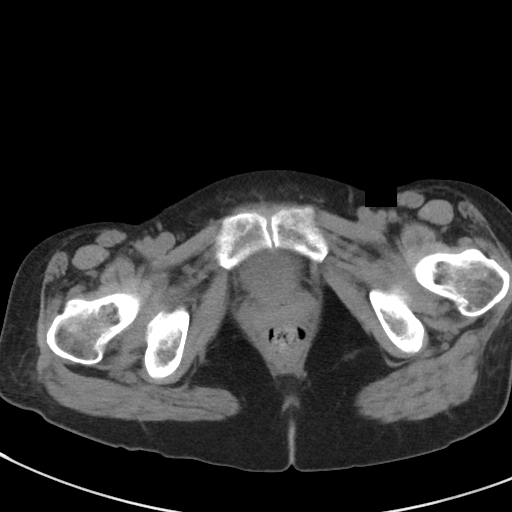
[im 16/77  soft-tissue]
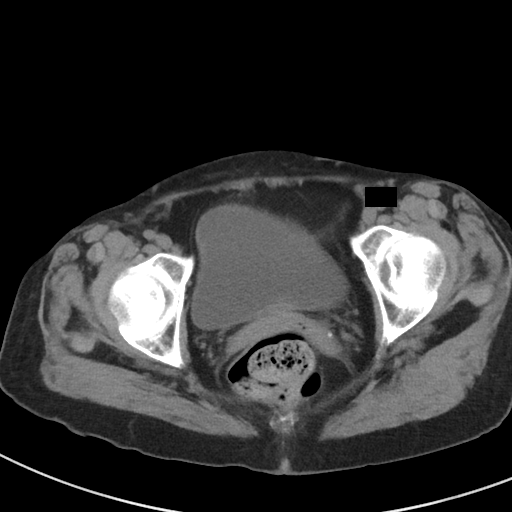
[im 23/77  soft-tissue]
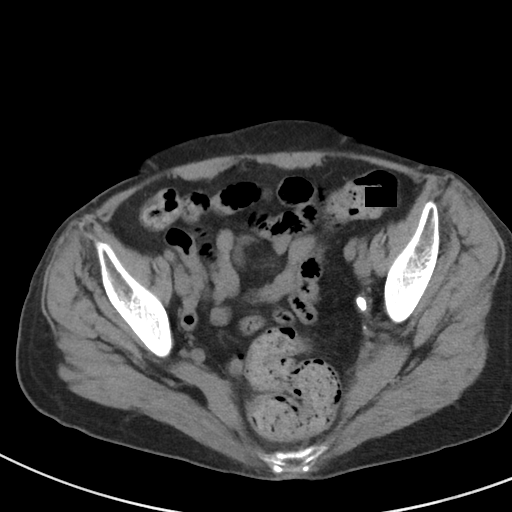
[im 26/77  soft-tissue]
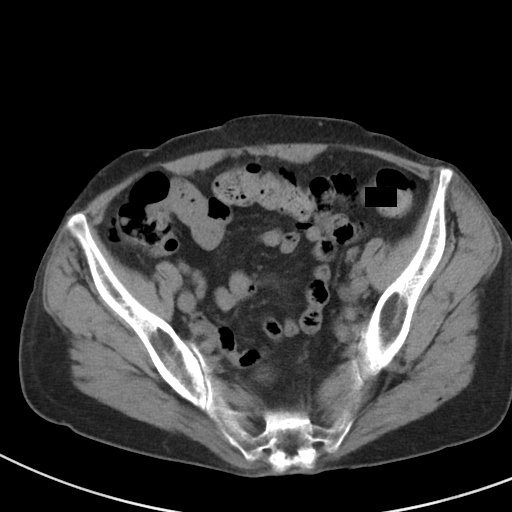
[im 32/77  soft-tissue]
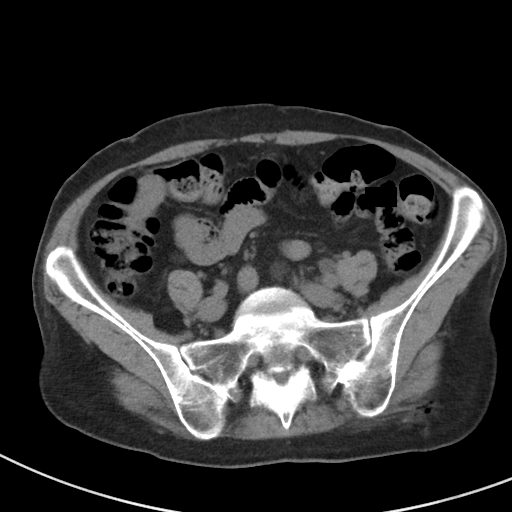
[im 39/77  soft-tissue]
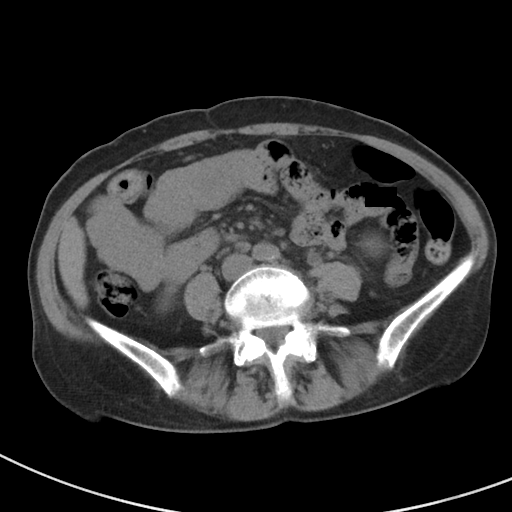
[im 45/77  soft-tissue]
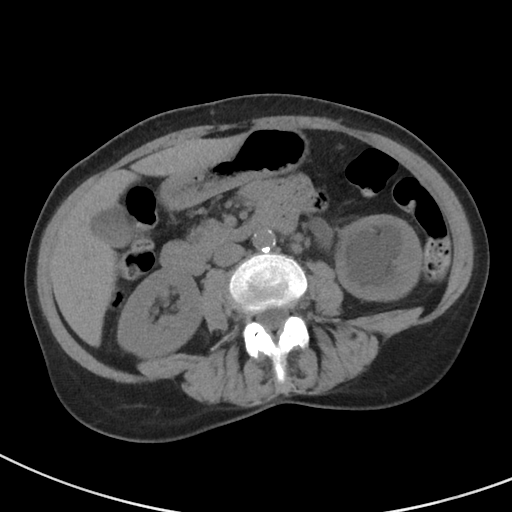
[im 51/77  soft-tissue]
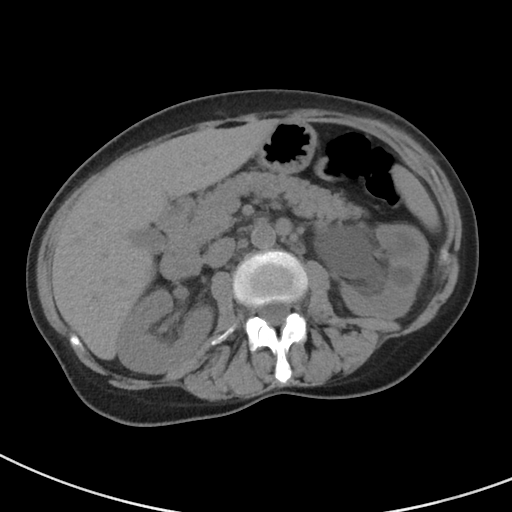
[im 51/77  bone]
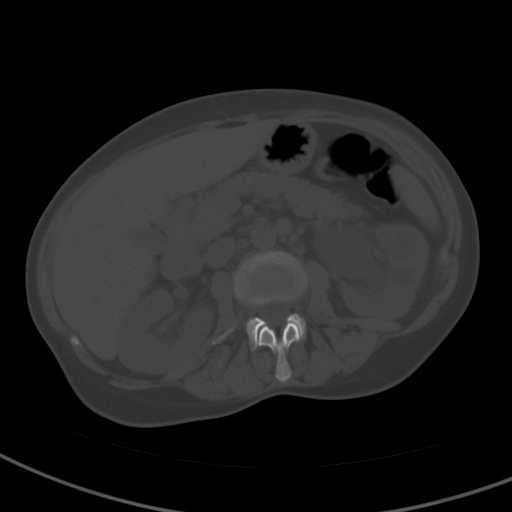
[im 54/77  soft-tissue]
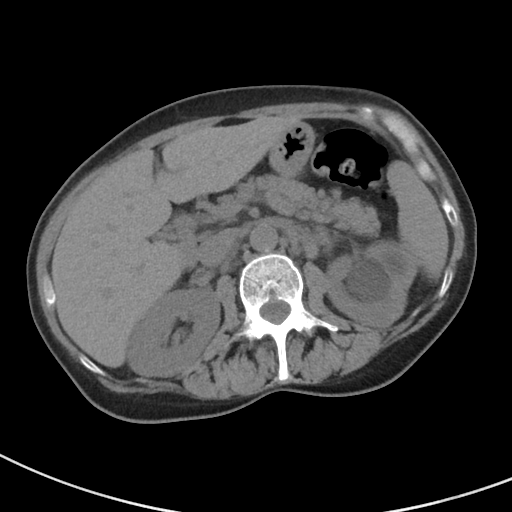
[im 61/77  soft-tissue]
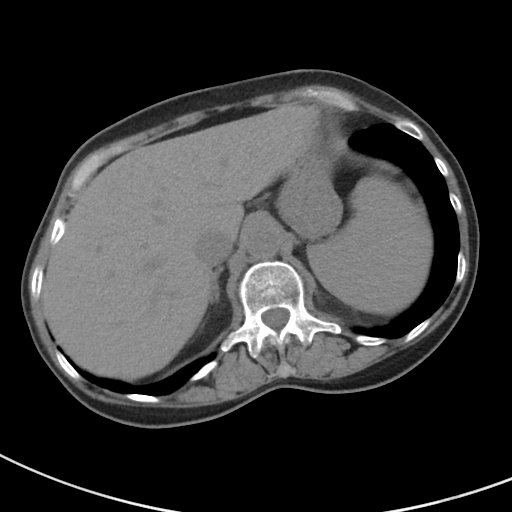
[im 67/77  soft-tissue]
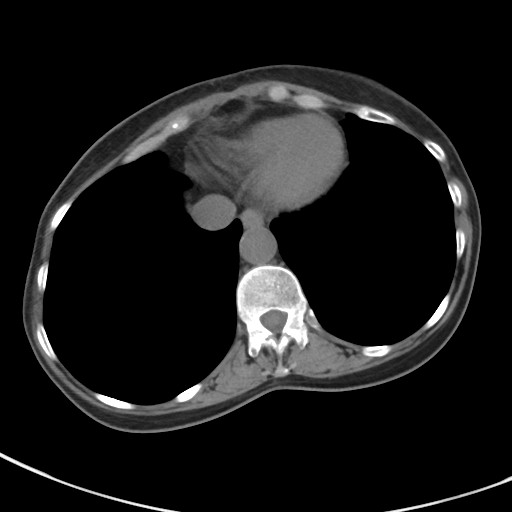
[im 73/77  soft-tissue]
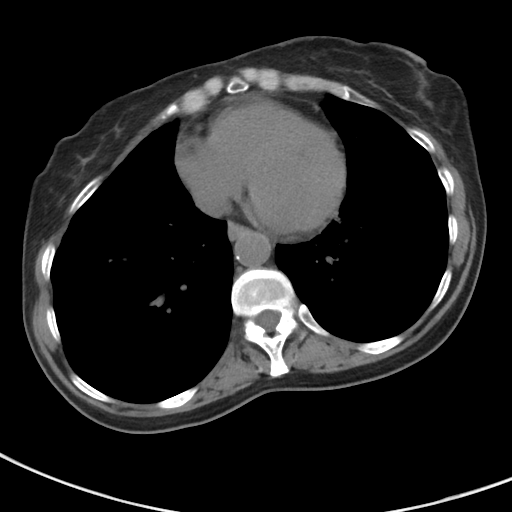

[Series 4: coronal st · coronal · 0.55mm/px · 3 of 79 slices shown]
[im 27/79  soft-tissue]
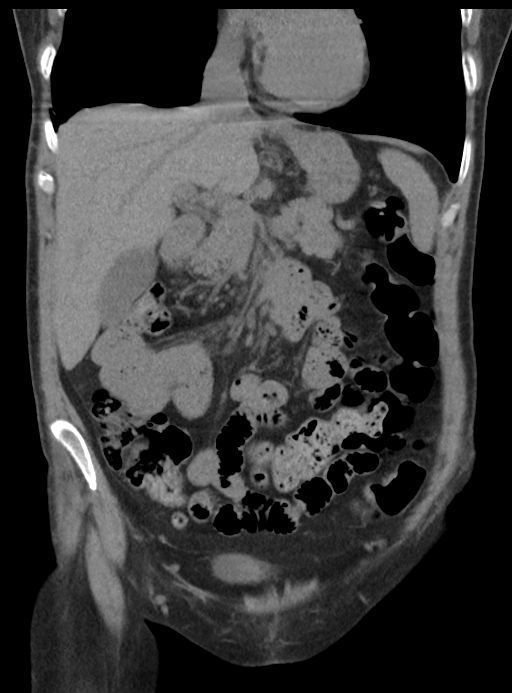
[im 35/79  soft-tissue]
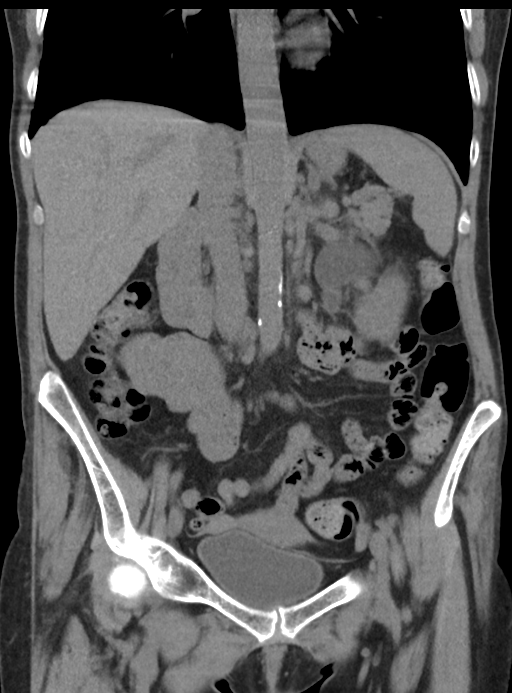
[im 44/79  soft-tissue]
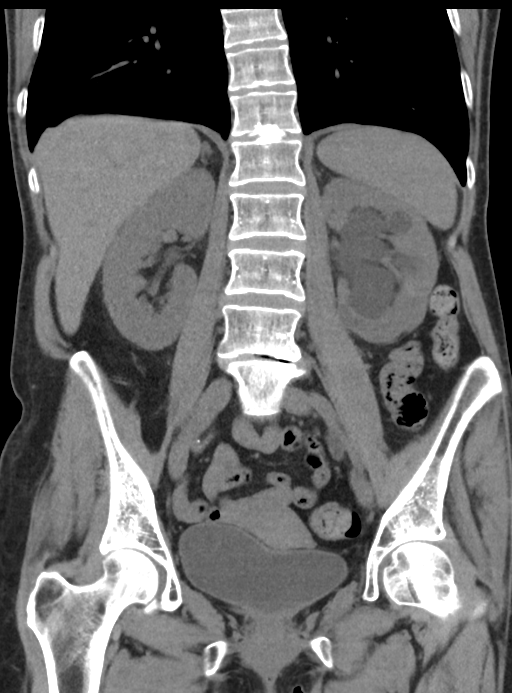

[16 of 46 positions shown; findings below may reference images not displayed]

FINDINGS: Lower chest: No acute abnormality.

Hepatobiliary: No focal liver abnormality is seen. No gallstones,
gallbladder wall thickening, or biliary dilatation.

Pancreas: Unremarkable. No pancreatic ductal dilatation or
surrounding inflammatory changes.

Spleen: Normal in size without focal abnormality.

Adrenals/Urinary Tract: There is severe left hydronephrosis and
hydroureter likely caused by 8 10 mm obstructive distal left
ureteral calculus located within the pelvic portion of the ureter.
There is perinephric stranding. There is left greater than right
retroperitoneal lymphadenopathy. With the largest lymph node
measuring 13 mm in short axis.

Stomach/Bowel: Stomach is within normal limits. Appendix appears
normal. No evidence of bowel wall thickening, distention, or
inflammatory changes.

Vascular/Lymphatic: No significant vascular findings are present.

Reproductive: Uterus and bilateral adnexa are unremarkable.
Bilateral fallopian tube closure devices in place.

Other: No abdominal wall hernia or abnormality. No abdominopelvic
ascites.

Musculoskeletal: No acute findings. Osteoarthritic changes of the
lumbosacral spine.
IMPRESSION: 10 mm left distal ureteral calculus with associated severe
obstructive uropathy.

Left greater than right retroperitoneal lymphadenopathy. This may
represent reactive lymphadenopathy, however follow-up with
contrast-enhanced CT after resolution of patient's acute symptoms
may be considered to exclude etiology other than left obstructive
uropathy.

## 2017-12-26 IMAGING — CT CT L SPINE W/O CM
3 of 4 series · 13 of 33 positions shown, 16 images · non-contrast
Comparison: None.

CLINICAL DATA: Fall on IAC steps yesterday. Chronic low back pain
and left leg pain. Unable to urinate since yesterday.

EXAM:
CT LUMBAR SPINE WITHOUT CONTRAST
TECHNIQUE: Multidetector CT imaging of the lumbar spine was performed without
intravenous contrast administration. Multiplanar CT image
reconstructions were also generated.

[Series 4: l spine soft · axial · 0.23mm/px · z∈[-402,-266]mm · 5 of 102 slices shown, 7 images]
[im 17/102  soft-tissue]
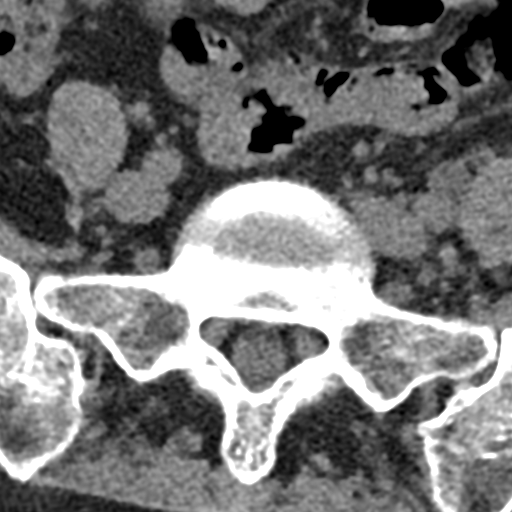
[im 17/102  bone]
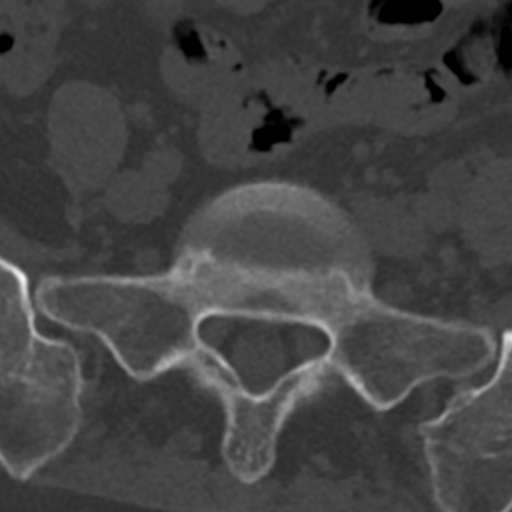
[im 34/102  bone]
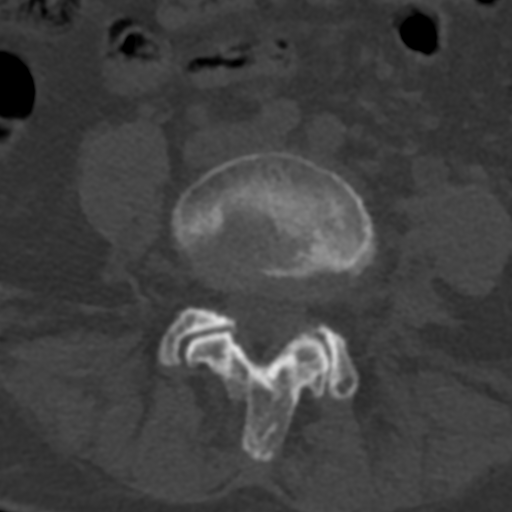
[im 51/102  bone]
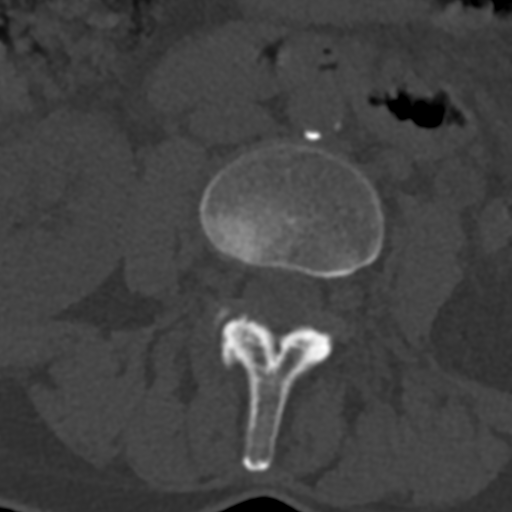
[im 68/102  bone]
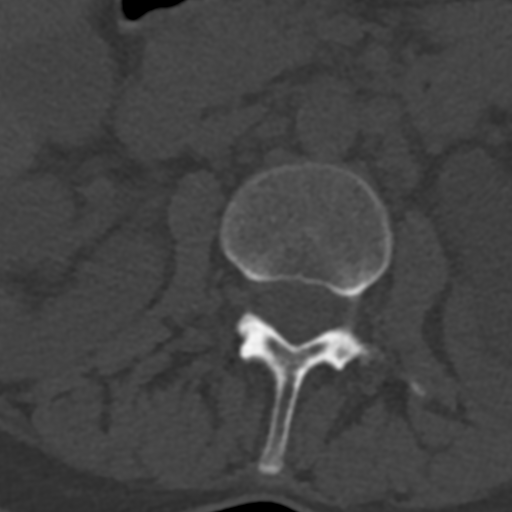
[im 85/102  soft-tissue]
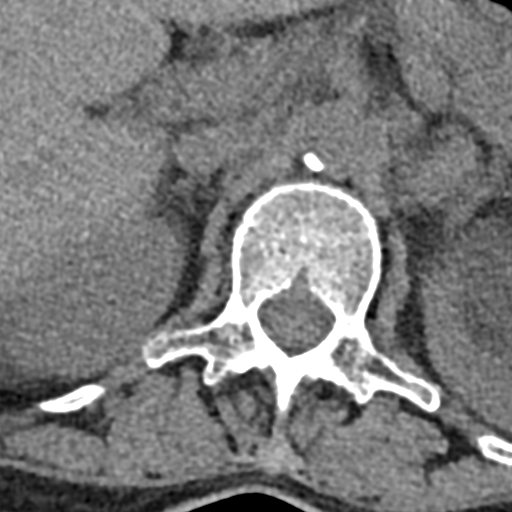
[im 85/102  bone]
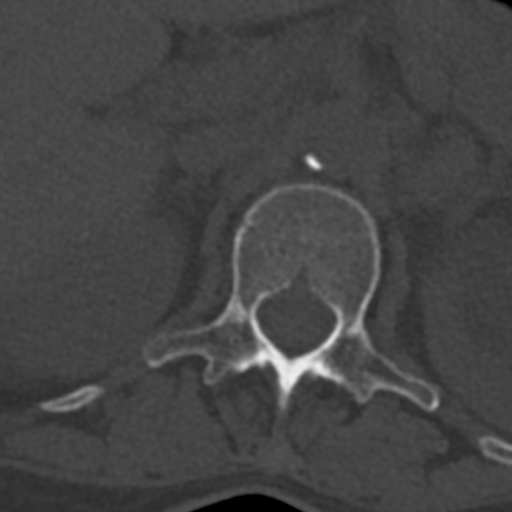

[Series 8: sagittal bone · sagittal · 0.32mm/px · 5 of 73 slices shown, 6 images]
[im 25/73  bone]
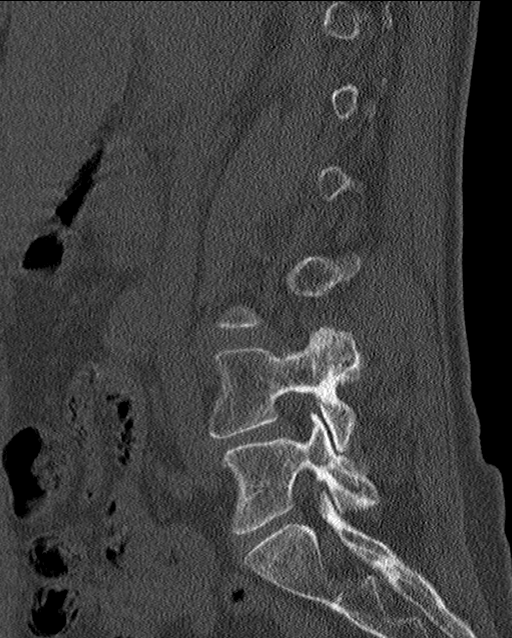
[im 31/73  bone]
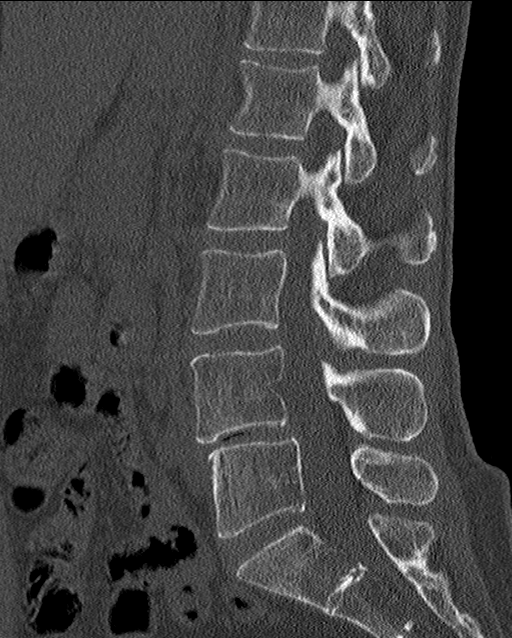
[im 37/73  soft-tissue]
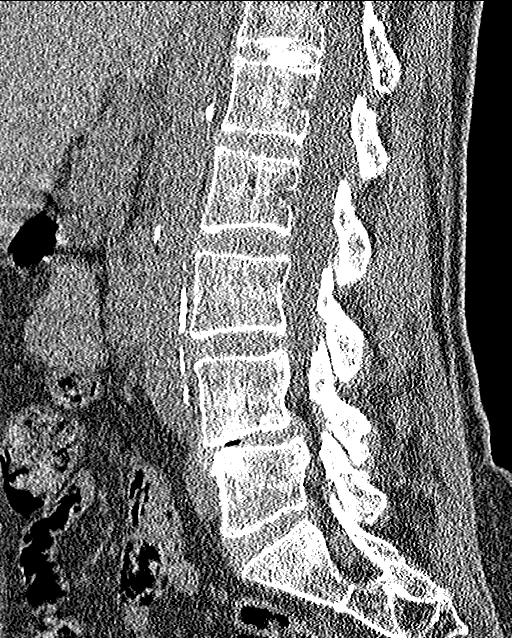
[im 37/73  bone]
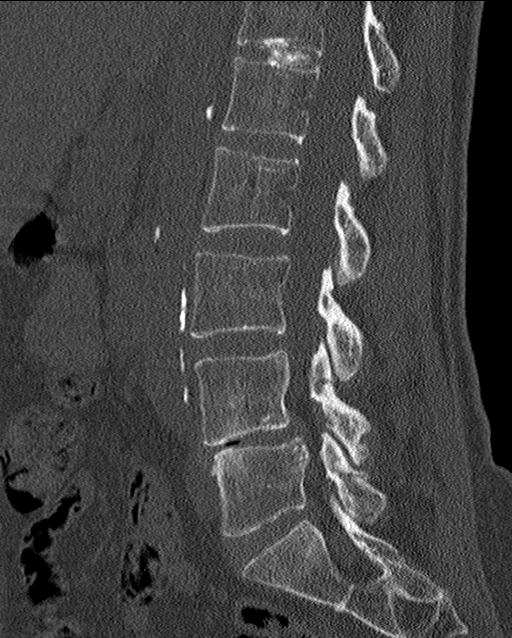
[im 43/73  bone]
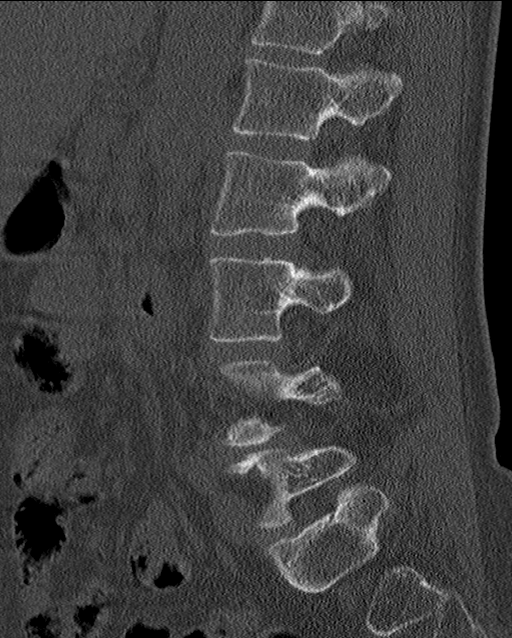
[im 49/73  bone]
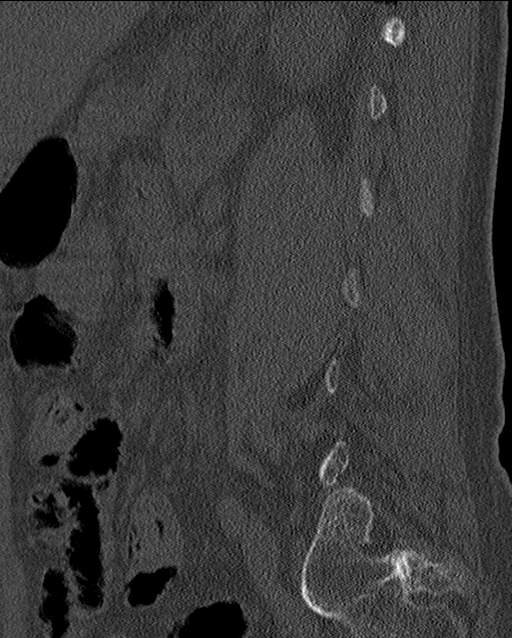

[Series 9: coronal bone · coronal · 0.28mm/px · 3 of 82 slices shown]
[im 17/82  bone]
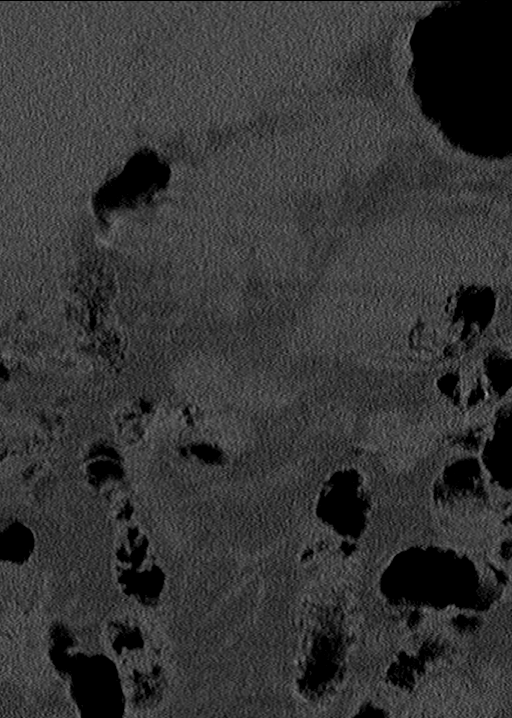
[im 33/82  bone]
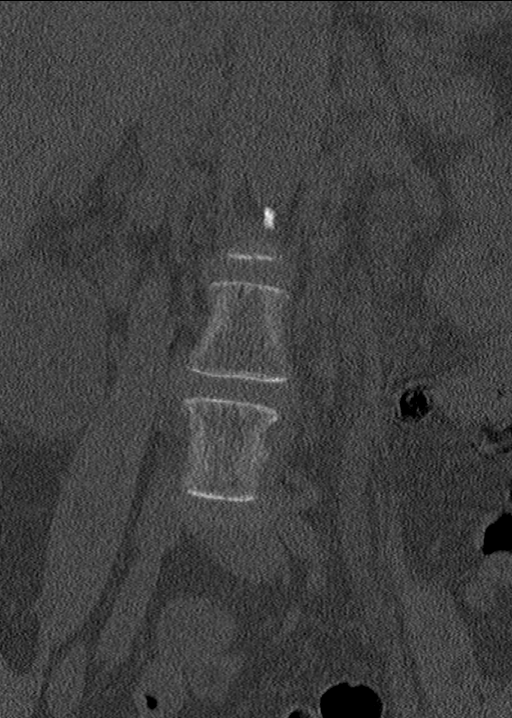
[im 49/82  bone]
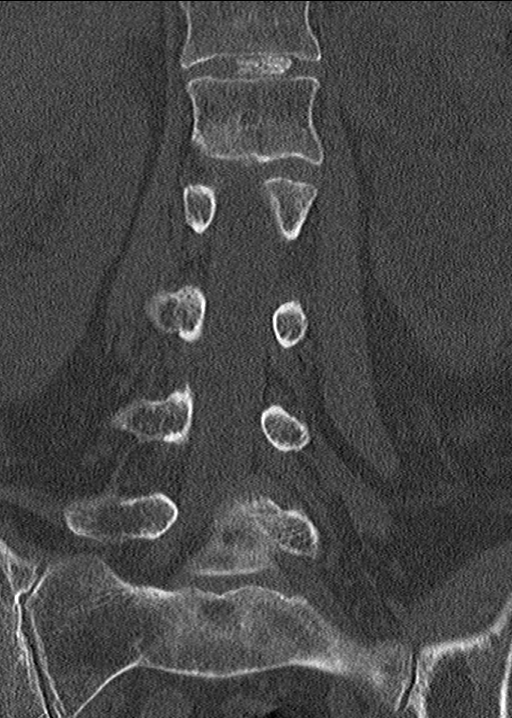

[13 of 33 positions shown; findings below may reference images not displayed]

FINDINGS: Segmentation: 5 lumbar type vertebrae.

Alignment: Mild lumbar levoscoliosis. 3 mm degenerative
anterolisthesis of L4 on L5.

Vertebrae: No acute fracture or focal pathologic process.

Paraspinal and other soft tissues: Abdominal aortic atherosclerosis
without aneurysm. Severe left hydronephrosis with moderate renal
cortical thinning. The visualized portion of the left ureter is
moderately dilated diffusely. The distal left ureter in the pelvis
was not imaged. Unremarkable noncontrast appearance of the right
kidney.

Disc levels:

T12-L1:  Discal calcification.  No disc herniation or stenosis.

L1-2:  Negative.

L2-3:  Negative.

L3-4: Minimal right facet arthrosis. No disc herniation or stenosis.

L4-5: Moderate disc space narrowing with vacuum disc phenomenon.
Listhesis with asymmetric leftward bulging of the uncovered disc and
mild facet arthrosis result in minimal left neural foraminal
narrowing. No evidence of exiting left L4 nerve compression. No
spinal stenosis.

L5-S1: Suspected small left paracentral disc protrusion near the
left S1 nerve root. No stenosis.
IMPRESSION: 1. No acute fracture.
2. L4-5 predominant disc degeneration with minimal left neural
foraminal narrowing.
3. Likely small left paracentral disc protrusion at L5-S1.
4. Severe left hydronephrosis with diffuse dilatation of the
visualized portion of the left ureter (distal left ureter not
imaged). Given left renal cortical thinning and lack of perinephric
stranding, this may not be acute.
5. Aortic atherosclerosis.
